# Patient Record
Sex: Male | Born: 1950 | Race: White | Hispanic: No | Marital: Married | Smoking: Current every day smoker
Health system: Southern US, Community
[De-identification: ages and names within clinical notes are randomized; demographics above are authoritative.]

---

## 2007-07-31 ENCOUNTER — Other Ambulatory Visit: Payer: Self-pay

## 2007-07-31 ENCOUNTER — Emergency Department: Payer: Self-pay | Admitting: Internal Medicine

## 2008-07-02 ENCOUNTER — Inpatient Hospital Stay: Payer: Self-pay | Admitting: Internal Medicine

## 2009-08-05 ENCOUNTER — Emergency Department: Payer: Self-pay | Admitting: Emergency Medicine

## 2011-01-17 ENCOUNTER — Ambulatory Visit: Payer: Self-pay

## 2011-10-01 ENCOUNTER — Emergency Department: Payer: Self-pay | Admitting: Emergency Medicine

## 2012-03-01 ENCOUNTER — Ambulatory Visit: Payer: Self-pay | Admitting: Emergency Medicine

## 2012-03-06 ENCOUNTER — Emergency Department: Payer: Self-pay | Admitting: Emergency Medicine

## 2013-08-02 ENCOUNTER — Emergency Department: Payer: Self-pay | Admitting: Emergency Medicine

## 2013-08-02 LAB — CBC
HCT: 45.2 % (ref 40.0–52.0)
HGB: 14.9 g/dL (ref 13.0–18.0)
MCH: 33.9 pg (ref 26.0–34.0)
MCHC: 32.9 g/dL (ref 32.0–36.0)
MCV: 103 fL — ABNORMAL HIGH (ref 80–100)
Platelet: 500 10*3/uL — ABNORMAL HIGH (ref 150–440)
RBC: 4.39 10*6/uL — ABNORMAL LOW (ref 4.40–5.90)
RDW: 14.6 % — ABNORMAL HIGH (ref 11.5–14.5)
WBC: 10.4 10*3/uL (ref 3.8–10.6)

## 2013-08-02 LAB — BASIC METABOLIC PANEL
Anion Gap: 3 — ABNORMAL LOW (ref 7–16)
BUN: 11 mg/dL (ref 7–18)
CHLORIDE: 106 mmol/L (ref 98–107)
Calcium, Total: 9.2 mg/dL (ref 8.5–10.1)
Co2: 30 mmol/L (ref 21–32)
Creatinine: 0.69 mg/dL (ref 0.60–1.30)
EGFR (African American): >60
EGFR (Non-African Amer.): >60
Glucose: 101 mg/dL — ABNORMAL HIGH (ref 65–99)
Osmolality: 277 (ref 275–301)
Potassium: 3.8 mmol/L (ref 3.5–5.1)
SODIUM: 139 mmol/L (ref 136–145)

## 2013-08-02 LAB — TROPONIN I

## 2013-09-01 ENCOUNTER — Emergency Department: Payer: Self-pay | Admitting: Emergency Medicine

## 2013-09-01 LAB — CBC WITH DIFFERENTIAL/PLATELET
BASOS ABS: 0 10*3/uL (ref 0.0–0.1)
BASOS PCT: 0.2 %
EOS ABS: 0.1 10*3/uL (ref 0.0–0.7)
EOS PCT: 1.3 %
HCT: 49.4 % (ref 40.0–52.0)
HGB: 16.5 g/dL (ref 13.0–18.0)
LYMPHS ABS: 4 10*3/uL — AB (ref 1.0–3.6)
LYMPHS PCT: 51.4 %
MCH: 35 pg — ABNORMAL HIGH (ref 26.0–34.0)
MCHC: 33.3 g/dL (ref 32.0–36.0)
MCV: 105 fL — ABNORMAL HIGH (ref 80–100)
Monocyte #: 0.6 x10 3/mm (ref 0.2–1.0)
Monocyte %: 8.1 %
NEUTROS PCT: 39 %
Neutrophil #: 3 10*3/uL (ref 1.4–6.5)
Platelet: 380 10*3/uL (ref 150–440)
RBC: 4.71 10*6/uL (ref 4.40–5.90)
RDW: 15.1 % — ABNORMAL HIGH (ref 11.5–14.5)
WBC: 7.7 10*3/uL (ref 3.8–10.6)

## 2013-09-01 LAB — COMPREHENSIVE METABOLIC PANEL
ALK PHOS: 121 U/L — AB
ANION GAP: 13 (ref 7–16)
AST: 54 U/L — AB (ref 15–37)
Albumin: 3.6 g/dL (ref 3.4–5.0)
BUN: 4 mg/dL — ABNORMAL LOW (ref 7–18)
Bilirubin,Total: 0.5 mg/dL (ref 0.2–1.0)
CHLORIDE: 99 mmol/L (ref 98–107)
Calcium, Total: 8.5 mg/dL (ref 8.5–10.1)
Co2: 26 mmol/L (ref 21–32)
Creatinine: 0.68 mg/dL (ref 0.60–1.30)
EGFR (African American): >60
Glucose: 103 mg/dL — ABNORMAL HIGH (ref 65–99)
Osmolality: 273 (ref 275–301)
POTASSIUM: 4 mmol/L (ref 3.5–5.1)
SGPT (ALT): 29 U/L (ref 12–78)
SODIUM: 138 mmol/L (ref 136–145)
Total Protein: 7.5 g/dL (ref 6.4–8.2)

## 2013-09-01 LAB — TROPONIN I: Troponin-I: <0.02 ng/mL

## 2013-09-01 LAB — CK TOTAL AND CKMB (NOT AT ARMC)
CK, TOTAL: 98 U/L
CK-MB: 2 ng/mL (ref 0.5–3.6)

## 2013-09-01 LAB — AMMONIA: Ammonia, Plasma: 22 mcmol/L (ref 11–32)

## 2013-09-01 LAB — TSH: Thyroid Stimulating Horm: 1.18 u[IU]/mL

## 2013-09-02 LAB — URINALYSIS, COMPLETE
BILIRUBIN, UR: NEGATIVE
Bacteria: NONE SEEN
Glucose,UR: NEGATIVE mg/dL (ref 0–75)
Ketone: NEGATIVE
LEUKOCYTE ESTERASE: NEGATIVE
NITRITE: NEGATIVE
Ph: 5 (ref 4.5–8.0)
Protein: NEGATIVE
RBC,UR: NONE SEEN /HPF (ref 0–5)
SPECIFIC GRAVITY: 1.003 (ref 1.003–1.030)
Squamous Epithelial: NONE SEEN
WBC UR: NONE SEEN /HPF (ref 0–5)

## 2013-09-02 LAB — DRUG SCREEN, URINE

## 2013-09-02 LAB — ETHANOL
ETHANOL %: 0.335 % — AB (ref 0.000–0.080)
Ethanol: 335 mg/dL

## 2014-11-20 ENCOUNTER — Emergency Department
Admission: EM | Admit: 2014-11-20 | Discharge: 2014-11-21 | Disposition: A | Discharge status: Home / Self Care | Payer: Medicare Other | Attending: Emergency Medicine | Admitting: Emergency Medicine

## 2014-11-20 ENCOUNTER — Other Ambulatory Visit: Payer: Self-pay

## 2014-11-20 ENCOUNTER — Emergency Department: Payer: Medicare Other

## 2014-11-20 DIAGNOSIS — H16002 Unspecified corneal ulcer, left eye: Secondary | ICD-10-CM | POA: Insufficient documentation

## 2014-11-20 DIAGNOSIS — Z720 Tobacco use: Secondary | ICD-10-CM | POA: Diagnosis not present

## 2014-11-20 DIAGNOSIS — H538 Other visual disturbances: Secondary | ICD-10-CM | POA: Diagnosis present

## 2014-11-20 LAB — COMPREHENSIVE METABOLIC PANEL
ALT: 17 U/L (ref 17–63)
ANION GAP: 9 (ref 5–15)
AST: 37 U/L (ref 15–41)
Albumin: 3.8 g/dL (ref 3.5–5.0)
Alkaline Phosphatase: 87 U/L (ref 38–126)
BILIRUBIN TOTAL: 1.2 mg/dL (ref 0.3–1.2)
BUN: 11 mg/dL (ref 6–20)
CALCIUM: 8.9 mg/dL (ref 8.9–10.3)
CO2: 31 mmol/L (ref 22–32)
Chloride: 93 mmol/L — ABNORMAL LOW (ref 101–111)
Creatinine, Ser: 0.56 mg/dL — ABNORMAL LOW (ref 0.61–1.24)
GFR calc non Af Amer: >60 mL/min (ref >60)
Glucose, Bld: 110 mg/dL — ABNORMAL HIGH (ref 65–99)
Potassium: 4 mmol/L (ref 3.5–5.1)
SODIUM: 133 mmol/L — AB (ref 135–145)
TOTAL PROTEIN: 7.6 g/dL (ref 6.5–8.1)

## 2014-11-20 LAB — URINALYSIS COMPLETE WITH MICROSCOPIC (ARMC ONLY)
BILIRUBIN URINE: NEGATIVE
Glucose, UA: NEGATIVE mg/dL
Hgb urine dipstick: NEGATIVE
Leukocytes, UA: NEGATIVE
NITRITE: NEGATIVE
PROTEIN: NEGATIVE mg/dL
SPECIFIC GRAVITY, URINE: 1.014 (ref 1.005–1.030)
Squamous Epithelial / LPF: NONE SEEN
pH: 6 (ref 5.0–8.0)

## 2014-11-20 LAB — CBC
HCT: 41.8 % (ref 40.0–52.0)
HEMOGLOBIN: 14.4 g/dL (ref 13.0–18.0)
MCH: 36.8 pg — ABNORMAL HIGH (ref 26.0–34.0)
MCHC: 34.3 g/dL (ref 32.0–36.0)
MCV: 107 fL — ABNORMAL HIGH (ref 80.0–100.0)
Platelets: 512 10*3/uL — ABNORMAL HIGH (ref 150–440)
RBC: 3.91 MIL/uL — AB (ref 4.40–5.90)
RDW: 13.2 % (ref 11.5–14.5)
WBC: 12.1 10*3/uL — AB (ref 3.8–10.6)

## 2014-11-20 LAB — LACTIC ACID, PLASMA: LACTIC ACID, VENOUS: 2.2 mmol/L — AB (ref 0.5–2.0)

## 2014-11-20 MED ORDER — TETRACAINE HCL 0.5 % OP SOLN
2.0000 [drp] | Freq: Once | OPHTHALMIC | Status: AC
  Administered 2014-11-21: 2 [drp] via OPHTHALMIC
  Filled 2014-11-20: qty 2

## 2014-11-20 MED ORDER — SODIUM CHLORIDE 0.9 % IV BOLUS (SEPSIS)
500.0000 mL | Freq: Once | INTRAVENOUS | Status: AC
  Administered 2014-11-20: 500 mL via INTRAVENOUS

## 2014-11-20 MED ORDER — VANCOMYCIN HCL IN DEXTROSE 1-5 GM/200ML-% IV SOLN
1000.0000 mg | Freq: Once | INTRAVENOUS | Status: AC
Start: 2014-11-20 — End: 2014-11-21
  Administered 2014-11-20: 1000 mg via INTRAVENOUS
  Filled 2014-11-20: qty 200

## 2014-11-20 NOTE — ED Notes (Signed)
Patient comes in from home complaining of generalized back and bilateral buttock pain.  Patient also complaining of not being able to see out of "good eye".  Patient reports that his good eye is the left eye.  Patient stated that he has not been able to see out of right eye for some time.  Both eyes with drainage.

## 2014-11-20 NOTE — ED Provider Notes (Signed)
Time Seen: Approximately 1815 I have reviewed the triage notes  Chief Complaint: Blurred Vision   History of Present Illness: Tim Cruz is a 64 y.o. male  who presents with some progressive left eye pain and visual loss over the last 3 weeks. The patient denies any headaches and states he can see shadows but cannot focus. He is also complaining of some low back pain which apparently is been going on for an extensive period of time. He denies any fever at home and apparently lives with some family members. He essentially had progressive decrease capability of ambulating over the last several months. Patient denies any eye trauma and is blind in his right eye. He states he had his "" I burned "" at the eye center and is not sure of the exact cause of his blindness. Patient's a very vague historian and denies any obvious chest or abdominal pain.   History reviewed. No pertinent past medical history.  There are no active problems to display for this patient.   History reviewed. No pertinent past surgical history.  History reviewed. No pertinent past surgical history.  No current outpatient prescriptions on file.  Allergies:  Review of patient's allergies indicates no known allergies.  Family History: History reviewed. No pertinent family history.  Social History: Social History  Substance Use Topics  . Smoking status: Current Every Day Smoker  . Smokeless tobacco: None  . Alcohol Use: Yes     Review of Systems:   10 point review of systems was performed and was otherwise negative:  Constitutional: No fever Eyes: No visual disturbances ENT: No sore throat, ear pain Cardiac: No chest pain Respiratory: No shortness of breath, wheezing, or stridor Abdomen: No abdominal pain, no vomiting, No diarrhea Endocrine: No weight loss, No night sweats Extremities: No peripheral edema, cyanosis Skin: No rashes, easy bruising Neurologic: No new focal weakness or progressive  weakness in both legs over the last several months and possibly years. Urologic: No dysuria, Hematuria, or urinary frequency   Physical Exam:  ED Triage Vitals  Enc Vitals Group     BP 11/20/14 1626 150/83 mmHg     Pulse Rate 11/20/14 1626 103     Resp 11/20/14 1626 18     Temp 11/20/14 1626 98 F (36.7 C)     Temp Source 11/20/14 1626 Oral     SpO2 11/20/14 1626 97 %     Weight 11/20/14 1626 104 lb (47.174 kg)     Height 11/20/14 1626  (1.778 m)     Head Cir --      Peak Flow --      Pain Score 11/20/14 2032 10     Pain Loc --      Pain Edu? --      Excl. in GC? --     General: Awake , Alert , and Oriented times 3; GCS 15 Head: Normal cephalic , atraumatic Eyes: Right eye is blind, close examination of the left eye shows no lesions surrounding the orbit. Extraocular eye movements are intact. Conjunctiva is very erythematous and swollen diffusely across the conjunctiva. The pupil shows a normal shape, but appears to have a hypopyon or corneal ulcer in the anterior chamber. Nose/Throat: No nasal drainage, patent upper airway without erythema or exudate.  Neck: Supple, Full range of motion, No anterior adenopathy or palpable thyroid masses Lungs: Clear to ascultation without wheezes , rhonchi, or rales Heart: Regular rate, regular rhythm without murmurs , gallops ,  or rubs Abdomen: Soft, non tender without rebound, guarding , or rigidity; bowel sounds positive and symmetric in all 4 quadrants. No organomegaly .        Extremities: 2 plus symmetric pulses. No edema, clubbing or cyanosis Neurologic: Bilateral lower extremity weakness, Motor symmetric without deficits, sensory intact Skin: warm, dry, no rashes   Labs:   All laboratory work was reviewed including any pertinent negatives or positives listed below:  Labs Reviewed  COMPREHENSIVE METABOLIC PANEL - Abnormal; Notable for the following:    Sodium 133 (*)    Chloride 93 (*)    Glucose, Bld 110 (*)    Creatinine,  Ser 0.56 (*)    All other components within normal limits  CBC - Abnormal; Notable for the following:    WBC 12.1 (*)    RBC 3.91 (*)    MCV 107.0 (*)    MCH 36.8 (*)    Platelets 512 (*)    All other components within normal limits   Radiology:      DG Chest 2 View (Final result) Result time: 11/20/14 21:08:57   Final result by Rad Results In Interface (11/20/14 21:08:57)   Narrative:   CLINICAL DATA: Elevated white blood cell count. Initial encounter.  EXAM: CHEST 2 VIEW  COMPARISON: 09/01/2013.  FINDINGS: Tortuous thoracic aorta. Patient rotated to the RIGHT on today's exam. Allowing for differences in projection and technique, this is unchanged compared to prior. Emphysema with exaggerated thoracic kyphosis. No focal consolidation. No pleural effusion. Old distal LEFT clavicle fracture. Carotid atherosclerosis incidentally noted. Aortic atherosclerosis. Multiple thoracic compression fractures account for the kyphosis.  IMPRESSION: Emphysema without acute cardiopulmonary disease.      I personally reviewed the radiologic studies   Procedures: Patient had an ophthalmology consultation, please see her note   ED Course:  Patient was seen and evaluated by ophthalmology unassigned who agreed the patient had a corneal ulcer in and would not require hospitalization they will need close follow-up in the eye center. Patient stated that he would be compliant with follow-up. His back pain seems to be a progressive issue and the patient does not appear to have any focal neurologic deficits that are new. He seems to have pain that's primarily from some very superficial decubitus ulcers.  The patient was given IV vancomycin here in emergency department    Final Clinical Impression left eye corneal ulcer  Final diagnoses:  None     Plan: Outpatient management The patient will be discharged on eyedrops determined by the pharmacy.          Jennye Moccasin, MD 11/21/14 [phone]

## 2014-11-21 DIAGNOSIS — H16002 Unspecified corneal ulcer, left eye: Secondary | ICD-10-CM | POA: Diagnosis not present

## 2014-11-21 LAB — KOH PREP
KOH PREP: NONE SEEN
Special Requests: NORMAL

## 2014-11-21 LAB — LACTIC ACID, PLASMA: LACTIC ACID, VENOUS: 1.7 mmol/L (ref 0.5–2.0)

## 2014-11-21 MED ORDER — VANCOMYCIN HCL 1000 MG IV SOLR
3.0000 mg | INTRAVENOUS | Status: DC
  Administered 2014-11-21: 3 mg
  Filled 2014-11-21 (×15): qty 1000

## 2014-11-21 MED ORDER — FLUORESCEIN SODIUM 1 MG OP STRP
ORAL_STRIP | OPHTHALMIC | Status: AC
  Administered 2014-11-21: 2 via OPHTHALMIC
  Filled 2014-11-21: qty 2

## 2014-11-21 MED ORDER — GENTAMICIN FORTIFIED OPHTHALMIC SOLUTION
1.0000 [drp] | OPHTHALMIC | Status: DC
  Administered 2014-11-21: 1 [drp] via OPHTHALMIC
  Filled 2014-11-21: qty 7

## 2014-11-21 MED ORDER — FLUORESCEIN SODIUM 1 MG OP STRP
1.0000 | ORAL_STRIP | Freq: Once | OPHTHALMIC | Status: AC
  Administered 2014-11-21: 2 via OPHTHALMIC

## 2014-11-21 NOTE — Discharge Instructions (Signed)
Corneal Ulcer °A corneal ulcer is an open sore on the cornea. The cornea is the clear covering at the front and center of the eye.  °CAUSES  °Most corneal ulcers are caused by infection, but there are other causes as well. °· Bacterial infection. A bacterial infection can occur and cause a corneal ulcer if: °¨ Contact lenses are worn too long (especially overnight) or are not properly cared for. °¨ An eye injury occurs, allowing bacteria to infect the area of injury. °· Viral infection. A viral infection can occur and cause a corneal ulcer if: °¨ The eye becomes infected with a virus, such as the herpes simplex (cold sore) virus, chickenpox virus, or shingles virus. °· Fungal infection. A fungal infection can occur and cause a corneal ulcer if: °¨ An eye injury resulted from contact with a plant or plant material. °¨ An anti-inflammatory eye drop is overused. °¨ You have a weakened immune system. °¨ Contact lenses are improperly cared for or become infected. °· Foreign bodies in the eye, such as sand, glass, or small pieces of glass or metal. °· Dry eyes. °· Certain disorders that prevent eyelids from closing completely, such as Bell's palsy. °· Contact lenses, especially extended-wear soft contact lenses. Contact lenses can: °¨ Scratch the cornea's surface, allowing bacteria to enter the scratch. °¨ Trap dirt underneath the contact lens, which can scratch the cornea. °¨ Harbor bacteria and fungi, making it more likely for bacterial infections to occur. °¨ Block oxygen from the cornea, making it more likely for infections to occur. °SYMPTOMS  °· Eye pain that is often severe. °· Blurry vision. °· Light sensitivity. °· Pus or thick discharge coming from your eye. °· Eye redness. °· Feeling like something is in your eye. °· Watery or itchy eye. °· Burning or stinging feeling. °Some ulcers that are very big may be seen as a white spot on the cornea. °DIAGNOSIS  °An eye exam will be performed. Your health care provider  may use a special kind of microscope (slit lamp) to look at the cornea. Eye drops may be put into the eye to make the ulcer easier to see. If it is suspected that an infection caused the corneal ulcer, tissue samples or cultures from the eye may be taken. Numbing eye drops will be given before any samples or cultures are taken. The samples or cultures will be examined in the lab to check for bacteria, viruses, or fungi. °TREATMENT  °Treatment of the corneal ulcer depends on the cause. If your ulcer is severe, you may be given antibiotic eye drops up until your health care provider knows the test results. Other treatments can include: °· Antibacterial, antiviral, or antifungal eye drops or ointment. °· Removing the foreign body that caused the eye injury. °· Artificial tears or a bandage contact lens if severe dry eyes caused the corneal ulcer. °· Over-the-counter or prescription pain medicine. °· Steroidal eye drops if the eye is inflamed and swollen. °· Antibiotic medicines by mouth. °· An injection of medicine under the thin membrane covering the eyeball (conjunctiva). This allows medicine to reach the ulcer in high doses. °· Eye patching to reduce irritation from blinking and bright light. An eye patch may not be given if the ulcer was caused by a bacterial infection. °If the corneal ulcer causes a scar on the cornea that interferes with vision, hospitalization and surgery may be needed to replace the cornea (corneal transplant). °HOME CARE INSTRUCTIONS  °· If prescribed, use your antibiotic   pills, eye drops, or ointment as directed. Continue using them even if you start to feel better. You may have to apply eye drops as often as every few minutes to every hour, for days. It may be necessary to set your alarm clock every few minutes to every hour during the night. This is absolutely necessary.  Only take over-the-counter or prescription medicines as directed by your health care provider.  Apply artificial  tears as needed if you have dry eyes.  Do not touch or rub your eye, because this may increase the irritation and spread the infection.  Avoid wearing eye makeup.  Stay in a dark room and use sunglasses if you have light sensitivity.  Apply cool packs to your eye to relieve discomfort and swelling.  If your eye is patched, you should not drive or use machinery. You will have reduced side vision and ability to judge distance.  Do not drive or operate machinery until approved by your health care provider. Your ability to judge distances is impaired.  Follow up with your health care provider as directed.  Do not wear contact lenses until your health care provider approves. If you normally wear contact lenses, follow these general rules to avoid the risk of a corneal ulcer:  Do not wear contact lenses while you sleep.  Wash your hands before removing contact lenses.  Properly sterilize and store your contact lenses.  Regularly clean your contact lens case.  Do not use your saliva or tap water to clean or wet your contact lenses.  Remove your contact lenses if your eye becomes irritated. You may put them back in once your eyes feel better. SEEK IMMEDIATE MEDICAL CARE IF:   You notice a change in your vision.  Your pain is getting worse, not better.  You have increasing discharge from the eye. MAKE SURE YOU:   Understand these instructions.  Will watch your condition.  Will get help right away if you are not doing well or get worse. Document Released: 03/20/2004 Document Revised: 10/13/2012 Document Reviewed: 07/13/2012 [facility] Patient Information 2015 [place], [place]. This information is not intended to replace advice given to you by your health care provider. Make sure you discuss any questions you have with your health care provider.  Please return immediately if condition worsens. Please contact her primary physician or the physician you were given for referral. If you  have any specialist physicians involved in her treatment and plan please also contact them. Thank you for using [facility]. Apply eye drops as suggested

## 2014-11-21 NOTE — Consult Note (Signed)
CONSULTING SERVICE: Ophthalmology  REQUESTING SERVICE: @ Principal problem: <principal problem not specified> Patient location: ED06A/ED06A   Reason for consult: Blurred vision OS  HPI:  The following history was obtained from the patient, the family, the primary team, and the available medical records.  Tim Cruz is a 64 y.o. 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 Notes no prior ocular history in left eye. Notes vision has precipitously declined in last few days.  Ocular History:  POAG, NLP OD Denies history of previous ocular trauma, surgeries, and injections. No family history of glaucoma, macular degeneration, or retinal detachment. Last eye exam: 2013  ROS:  10 point review of systems completed.  Negative other than as mentioned in HPI.   Allergies:  No Known Allergies  Eye Meds / Other Outpatient Meds: Reviewed   Inpatient Medications: Reviewed    Past Medical History: Reviewed  History reviewed. No pertinent past medical history.   Past Surgical History:  History reviewed. No pertinent past surgical history.  Family History:  History reviewed. No pertinent family history.   Social History: History  Alcohol Use  . Yes   History  Sexual Activity  . Sexual Activity: Not on file   History  Drug Use No   History  Smoking status  . Current Every Day Smoker  Smokeless tobacco  . Not on file   Social History   Social History Narrative  . No narrative on file    reports that he has been smoking.  He does not have any smokeless tobacco history on file. He reports that he drinks alcohol. He reports that he does not use illicit drugs.  PHYSICAL EXAM:   Pthysical eye OD; OS exam below:  SLE:  L/L: wnl S/C: chemosis/injections OS K: 2 mm inferior K ulcer, with 3 mm "soupy" infiltrate AC: deep, some fibrin in AC, no hypoyon L:  NS AV: no visualizable    Assessment &  Plan: Tim Cruz is a 64 y.o. male with corneal ulcer to left eye. Plan culture, gram stain, KOH stain today. Start vigamox q1 hr as well as fortified antibiotics (vancomycin/tobramycin) if available. Plan to follow up in [facility] tomorrow.   Eye Care Follow up: Tomorrow, [facility]   Thank you for allowing [place] to participate in the care of this patient. If you have any further questions, please do not hesitate to contact us at [facility].

## 2014-11-23 LAB — EYE CULTURE: Special Requests: NORMAL

## 2014-12-12 LAB — CULTURE, FUNGUS WITHOUT SMEAR: SPECIAL REQUESTS: NORMAL

## 2016-03-05 IMAGING — CR DG CHEST 2V
1 series · 2 of 2 positions shown · non-contrast
Comparison: March 06, 2012

CLINICAL DATA: Cough and difficulty breathing

EXAM:
CHEST  2 VIEW

[Series 4: w chest pa · 0.14mm/px · 2 of 2 slices shown]
[im 1/2]
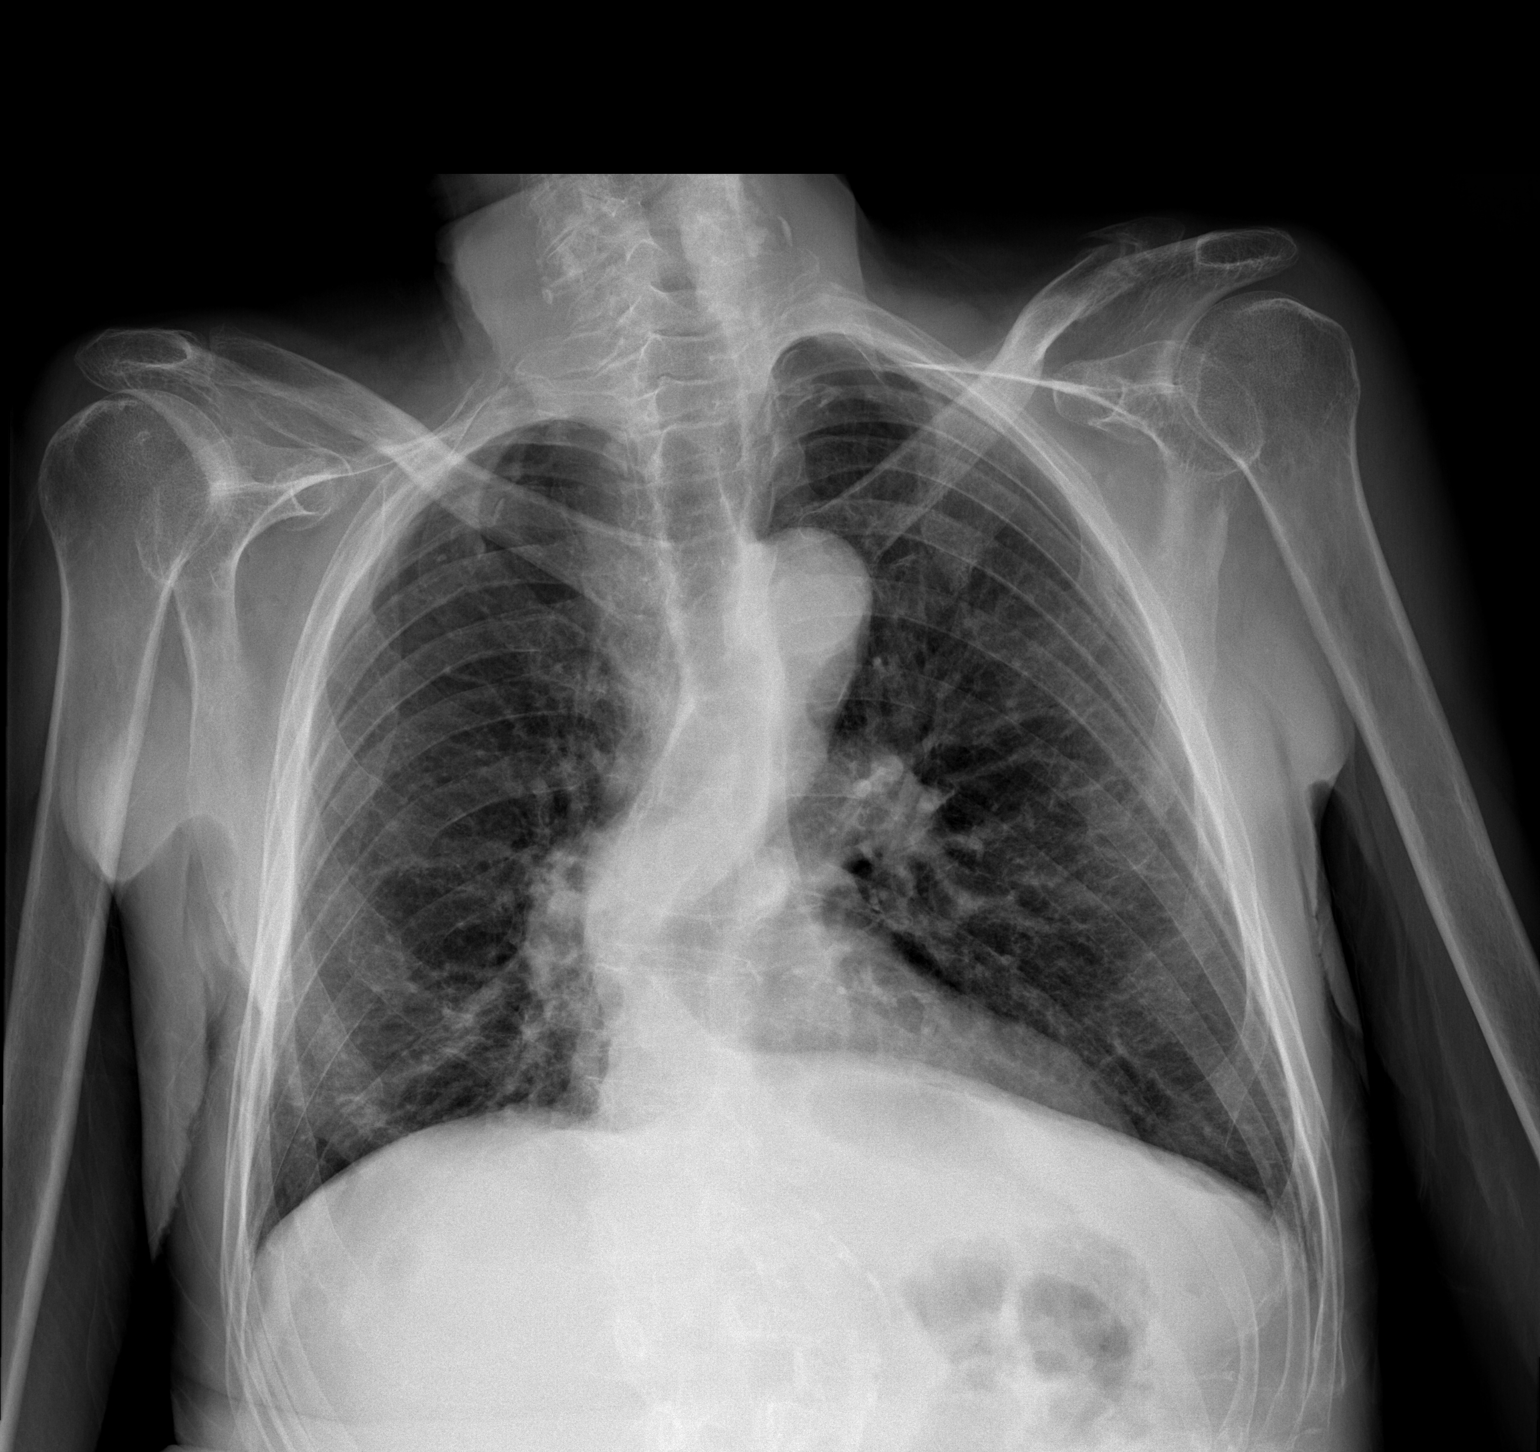
[im 2/2]
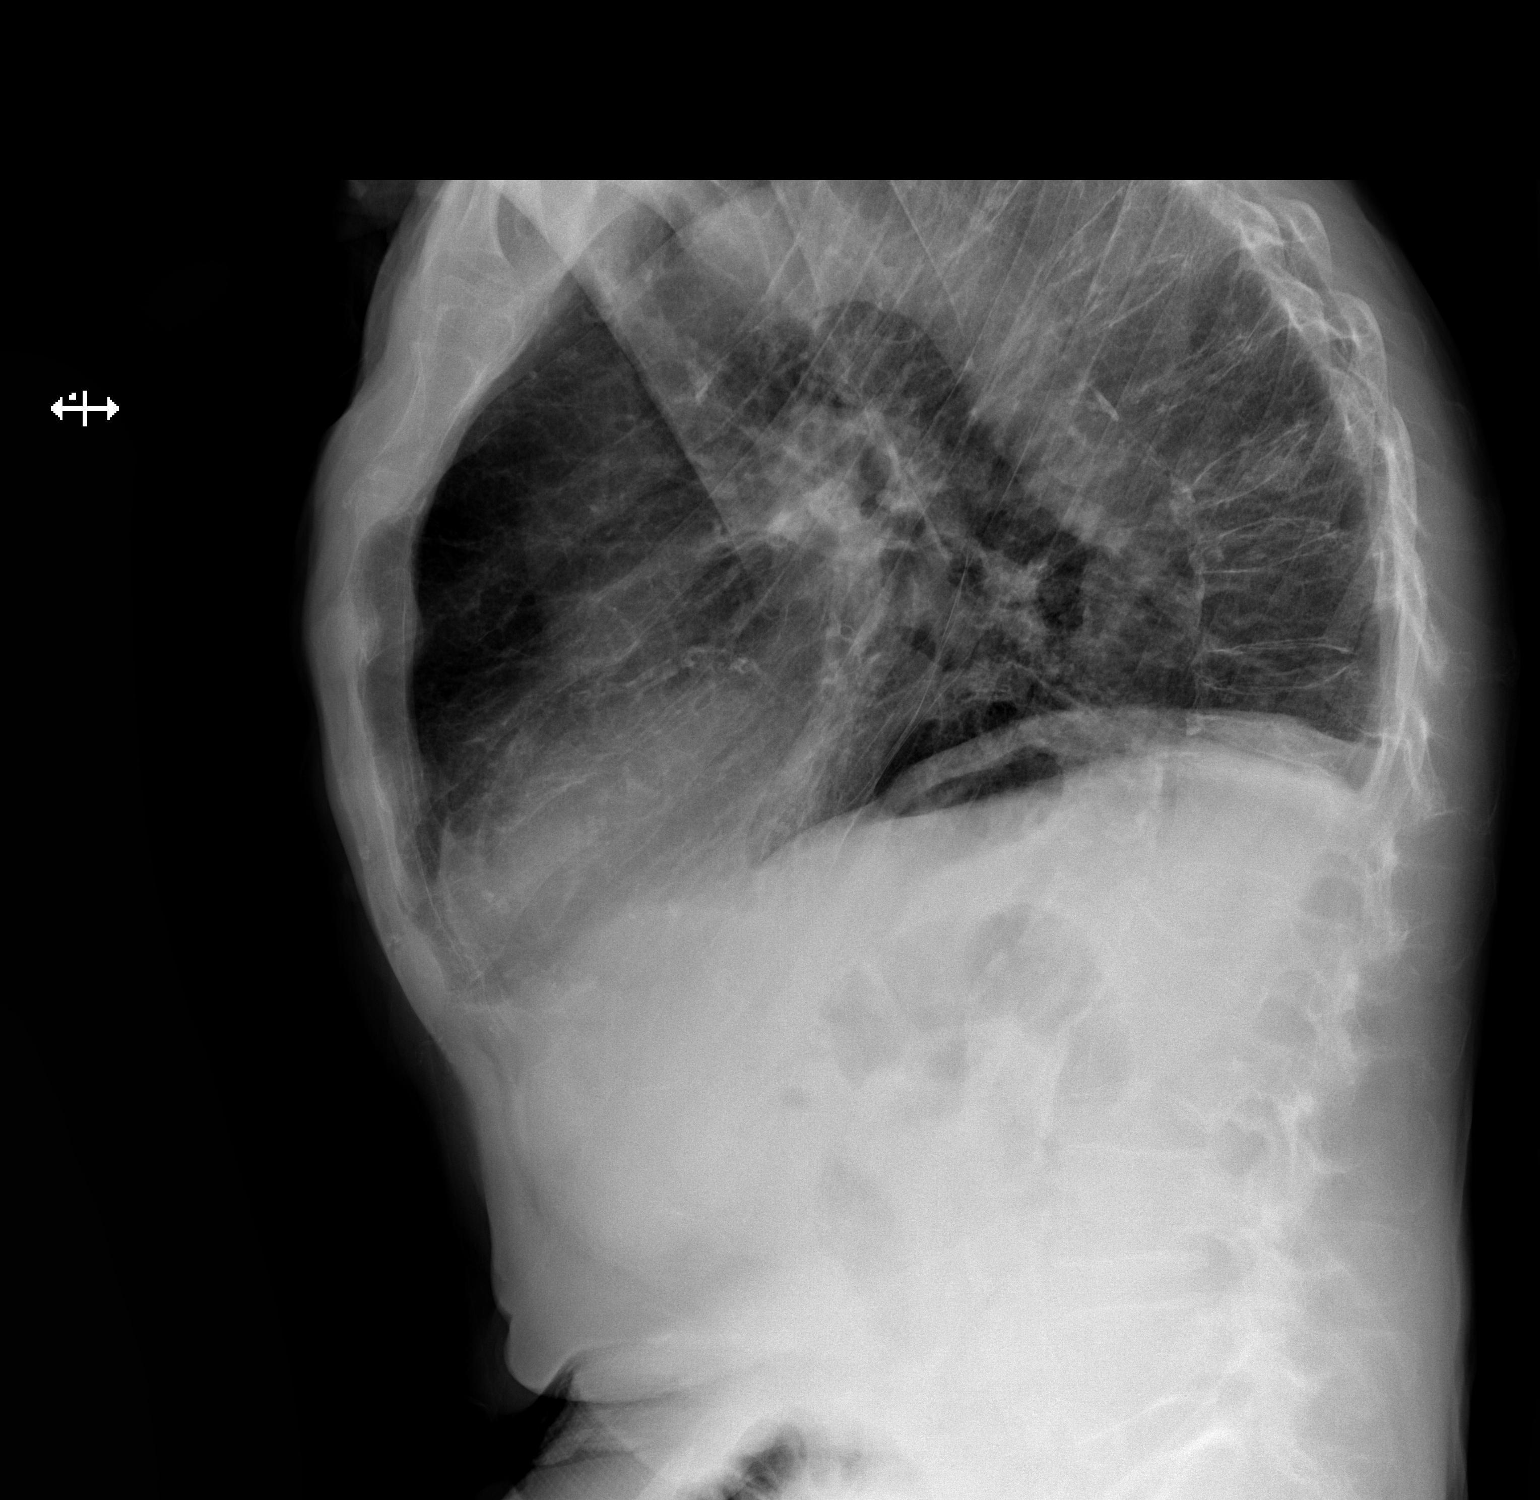

[2 of 2 positions shown; findings below may reference images not displayed]

FINDINGS: There is underlying emphysematous change. There is no edema or
consolidation. Heart size is within normal limits. Pulmonary
vascularity reflects underlying emphysema. Aorta is tortuous but
stable. No adenopathy. Bones are osteoporotic. There are multiple
compression fractures in the thoracic spine with increased kyphosis.
There is calcification in multiple coronary arteries, stable.
IMPRESSION: Emphysematous change. Multiple thoracic compression fractures. No
edema or consolidation. Multifocal coronary artery calcification.

## 2016-04-04 IMAGING — CT CT HEAD WITHOUT CONTRAST
4 of 5 series · 16 of 30 positions shown, 17 images · non-contrast
Comparison: None.

CLINICAL DATA: History of alcohol abuse. Patient is not eaten for 4
days.

EXAM:
CT HEAD WITHOUT CONTRAST
TECHNIQUE: Contiguous axial images were obtained from the base of the skull
through the vertex without intravenous contrast.

[Series 4: head bone · axial · 0.43mm/px · z∈[+1115,+1269]mm · 8 of 91 slices shown (1 of 2)]
[im 7/91  bone]
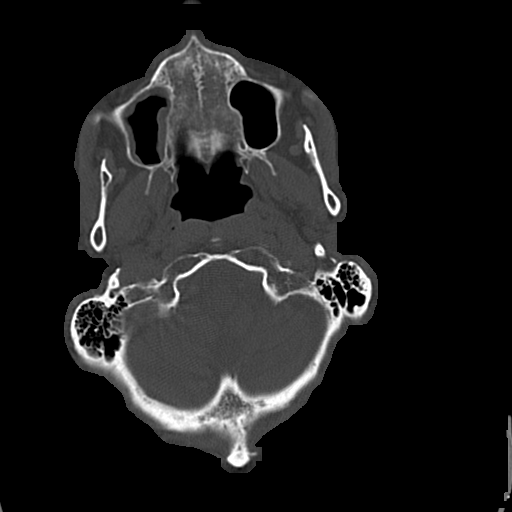
[im 20/91  bone]
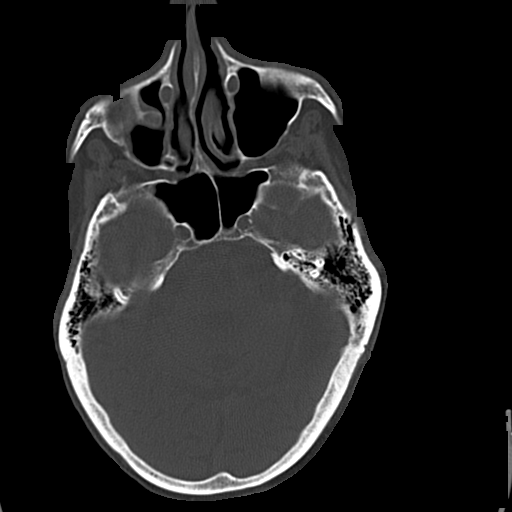
[im 33/91  bone]
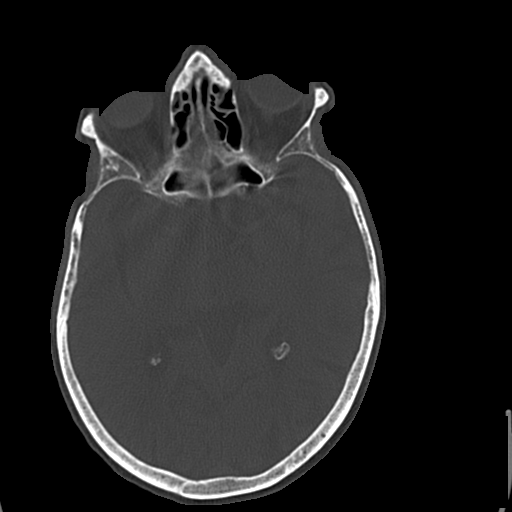
[im 39/91  bone]
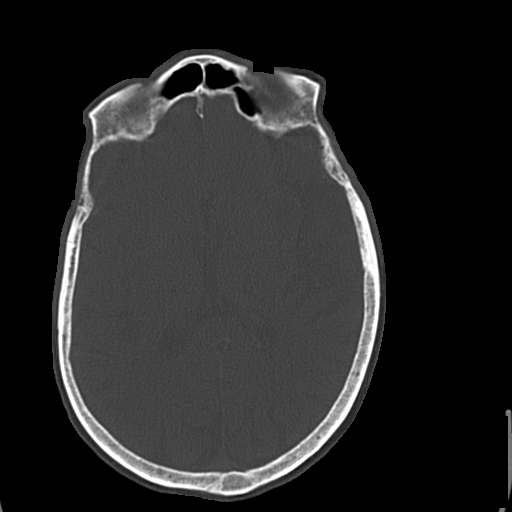
[im 52/91  bone]
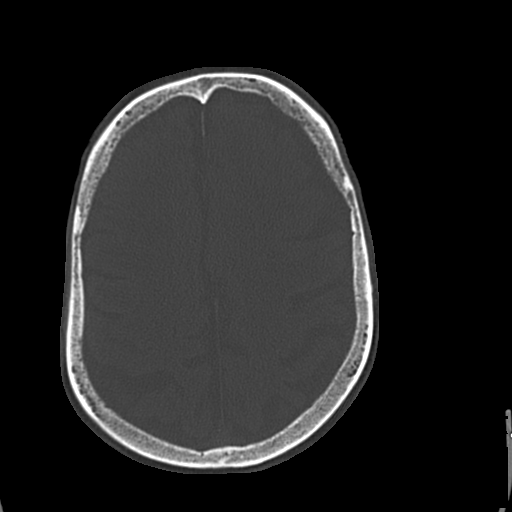
[im 58/91  bone]
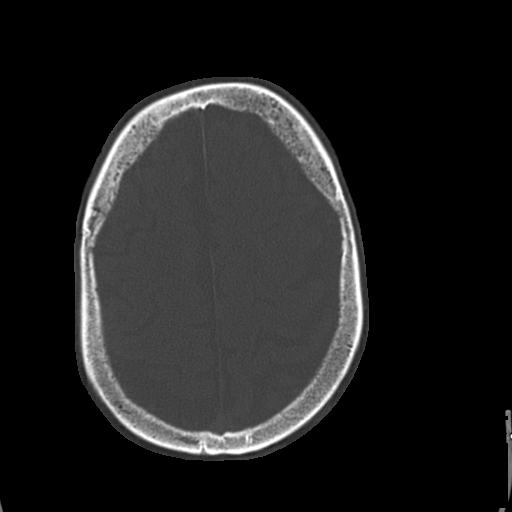
[im 71/91  bone]
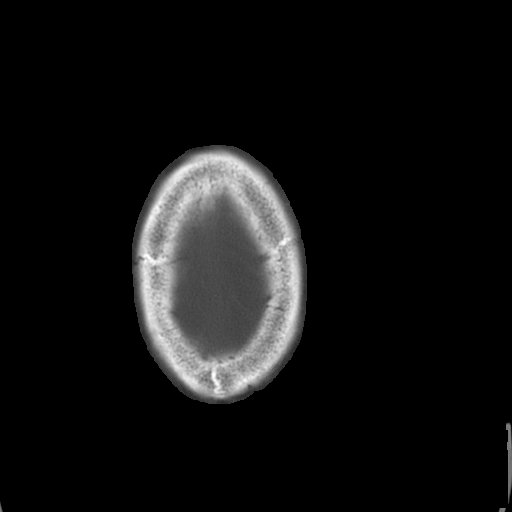
[im 84/91  bone]
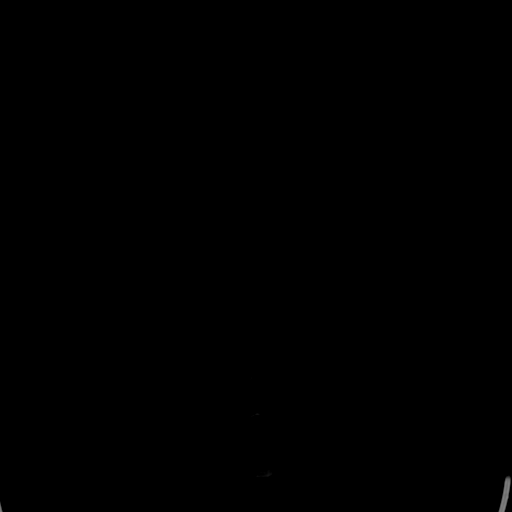

[Series 5: head bone · axial · 0.43mm/px · z∈[+1105,+1123]mm · 2 of 28 slices shown (2 of 2)]
[im 10/28  bone]
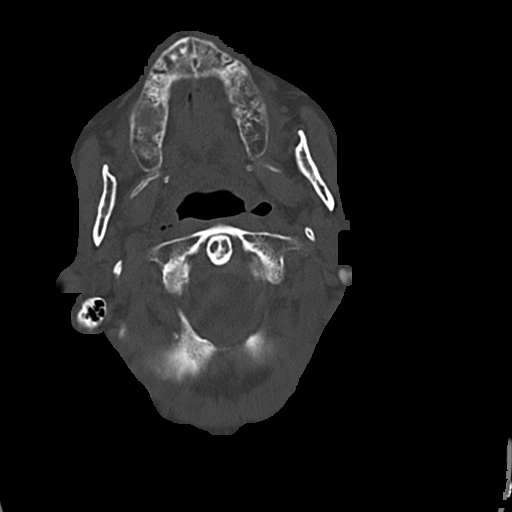
[im 19/28  bone]
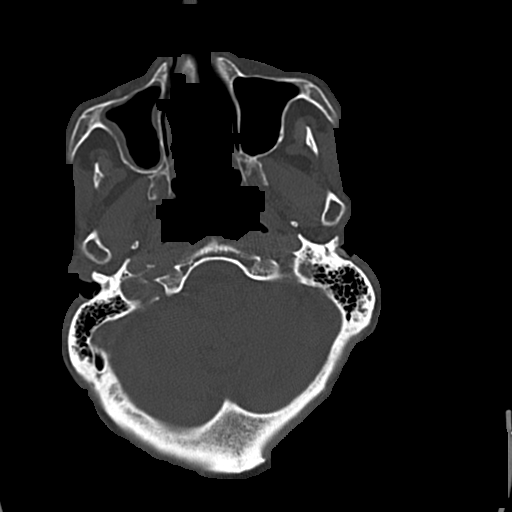

[Series 6: head wo · axial · 0.43mm/px · z∈[+1148,+1228]mm · 3 of 32 slices shown, 4 images]
[im 8/32  brain]
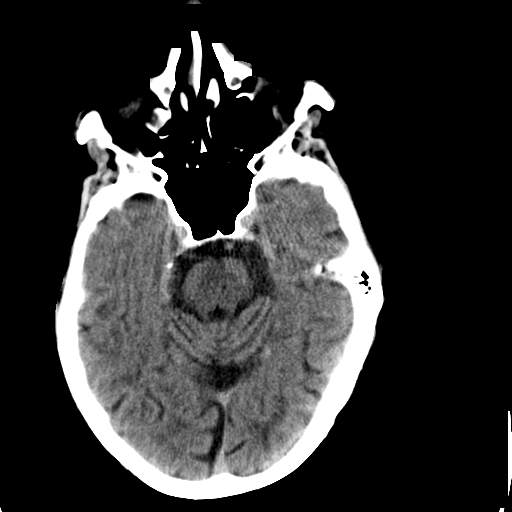
[im 8/32  bone]
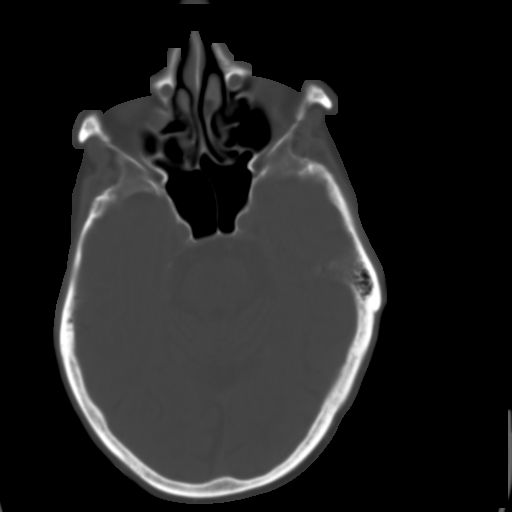
[im 16/32  brain]
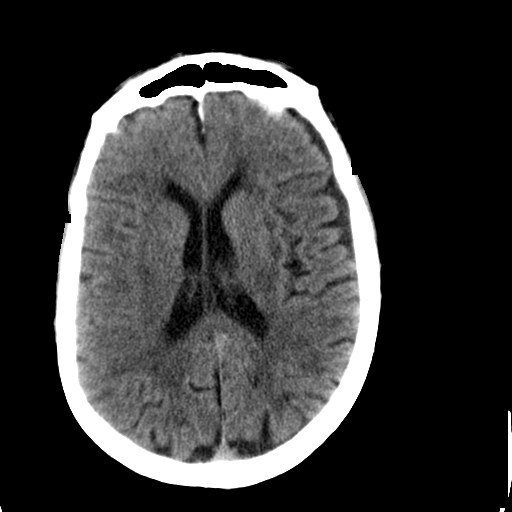
[im 24/32  brain]
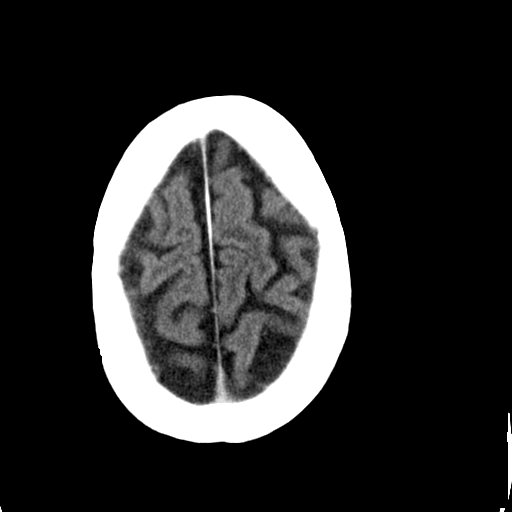

[Series 10: head wo (id) · axial · 0.49mm/px · z∈[+1233,+1289]mm · 3 of 29 slices shown]
[im 8/29  brain]
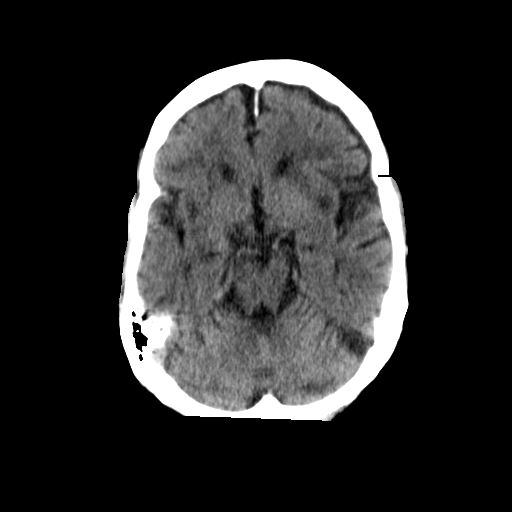
[im 15/29  brain]
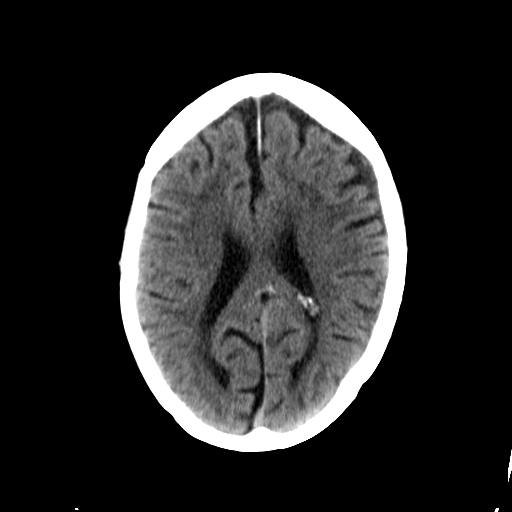
[im 22/29  brain]
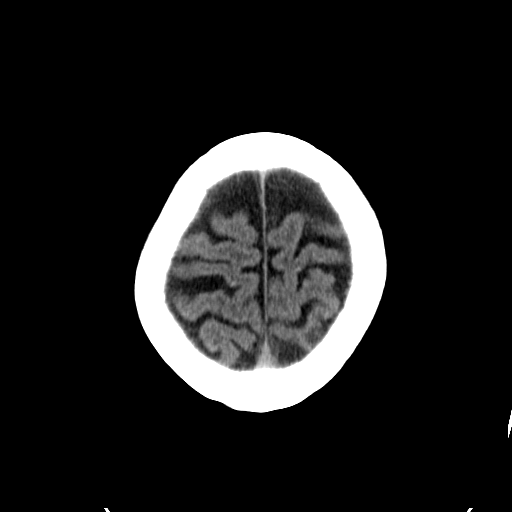

[16 of 30 positions shown; findings below may reference images not displayed]

FINDINGS: Technically limited study due to motion artifact despite repeat
imaging. Diffuse cerebral atrophy. Low-attenuation changes
throughout the deep white matter consistent with small vessel
ischemia. No mass effect or midline shift. No abnormal extra-axial
fluid collections. Gray-white matter junctions appear distinct.
Basal cisterns are not effaced. No evidence of acute intracranial
hemorrhage. Calvarium appears intact. Mucosal thickening in the
right maxillary antrum. Mastoid air cells are not opacified.
IMPRESSION: No acute intracranial abnormalities. Chronic atrophy and small
vessel ischemic changes.

## 2017-06-23 IMAGING — CR DG LUMBAR SPINE 2-3V
1 series · 3 of 3 positions shown · non-contrast
Comparison: MRI of lumbar spine January 17, 2011

CLINICAL DATA: Generalized back pain and buttock pain.

EXAM:
LUMBAR SPINE - 2-3 VIEW

[Series 1: dg lumbar spine 2-3 views · 0.14mm/px · 3 of 3 slices shown]
[im 1/3]
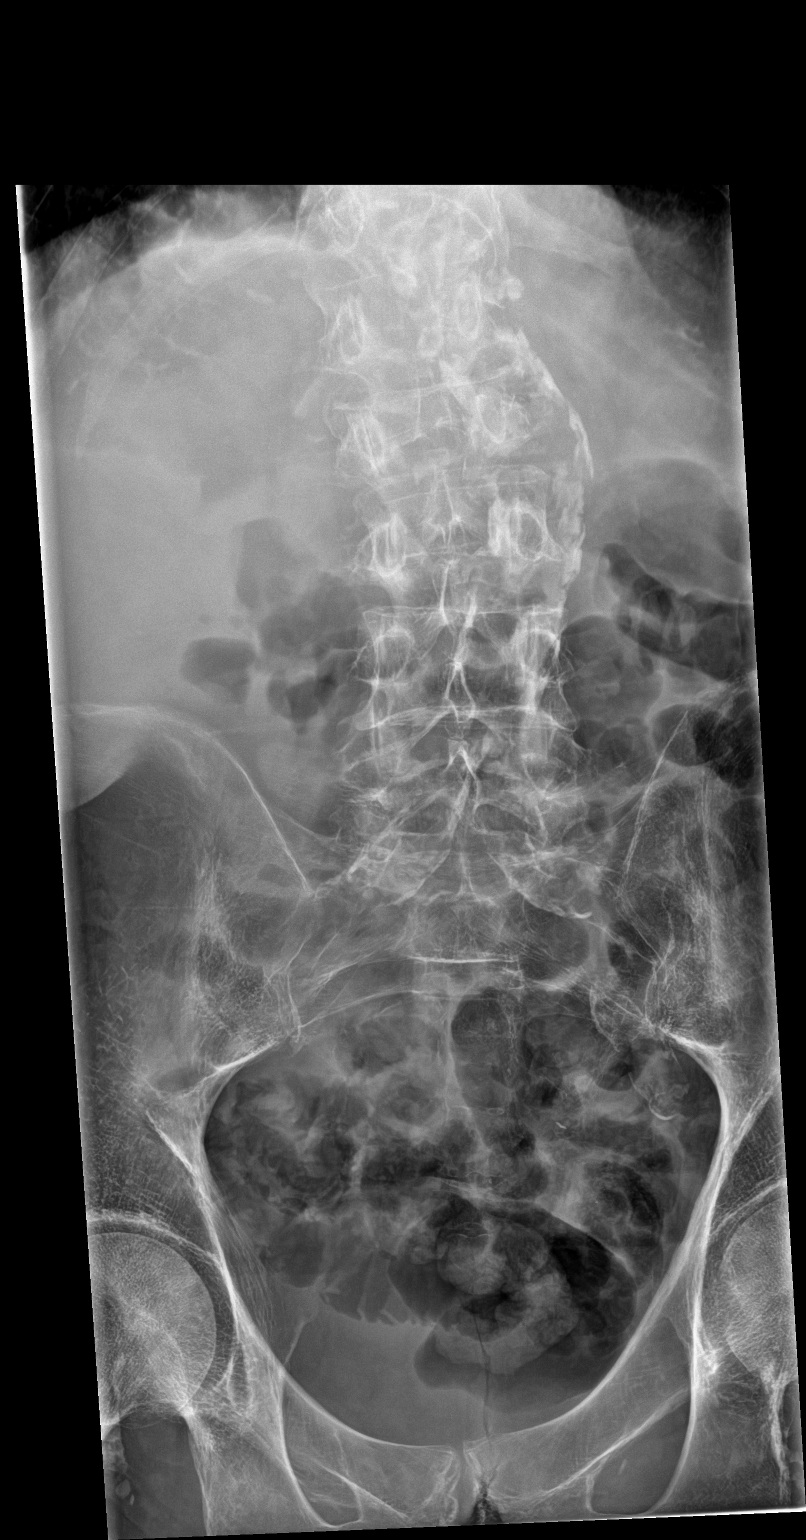
[im 2/3]
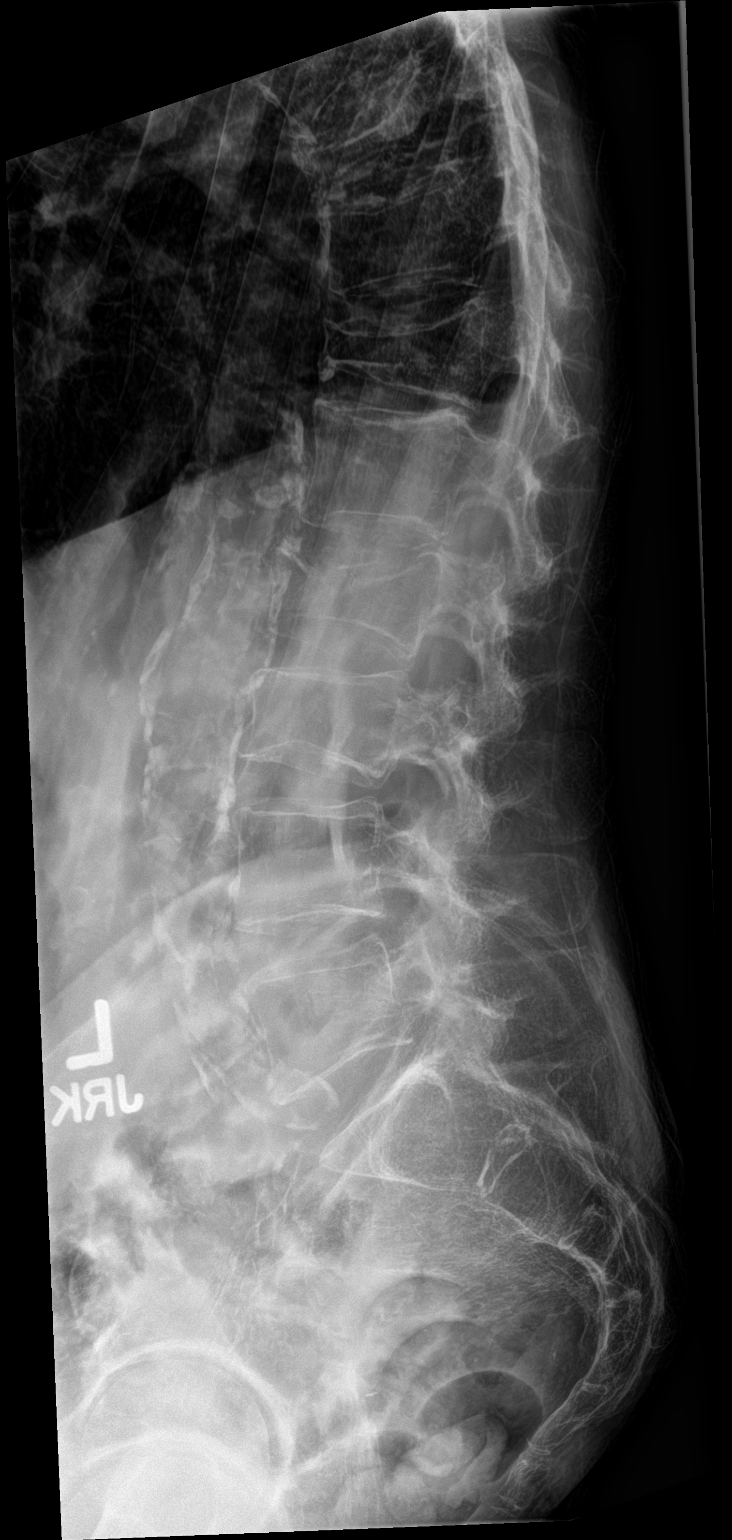
[im 3/3]
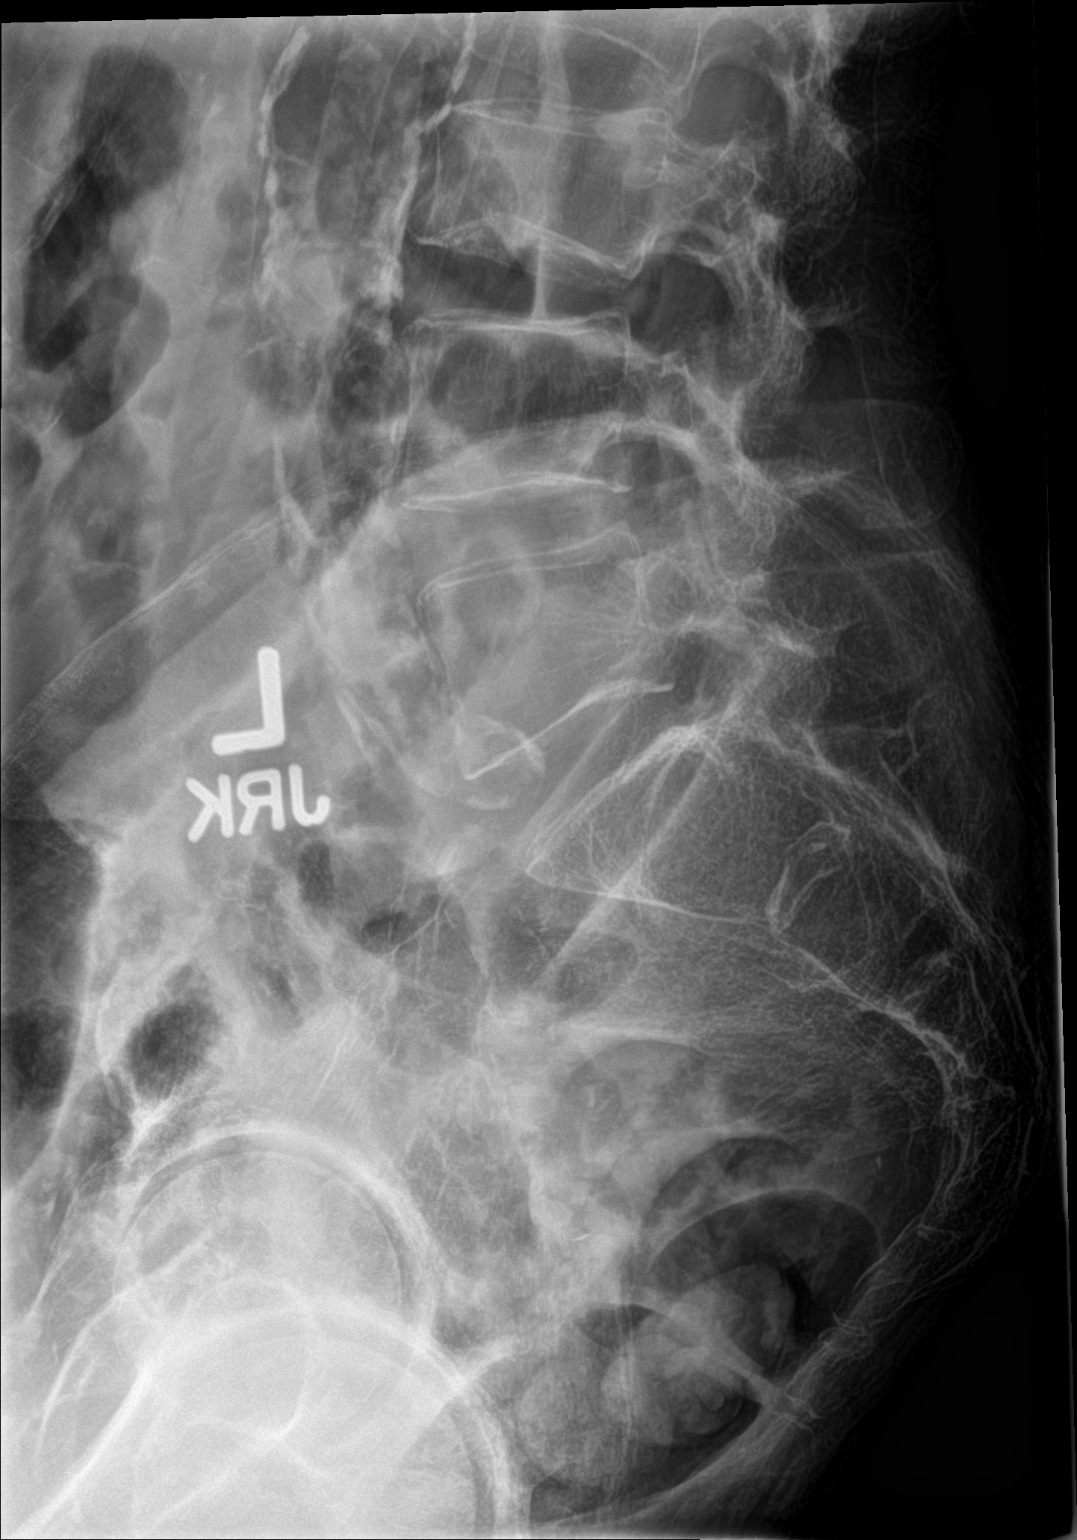

[3 of 3 positions shown; findings below may reference images not displayed]

FINDINGS: There is no evidence of lumbar spine fracture. Alignment is normal.
There are chronic compression deformities of L3, L2, T12, T10.
IMPRESSION: No acute fracture or dislocation. Chronic compression deformities as
described.
